# Patient Record
Sex: Male | Born: 1945 | Race: White | Hispanic: No | Smoking: Former smoker
Health system: Southern US, Community
[De-identification: ages and names within clinical notes are randomized; demographics above are authoritative.]

## PROBLEM LIST (undated history)

## (undated) DIAGNOSIS — F329 Major depressive disorder, single episode, unspecified: Secondary | ICD-10-CM

## (undated) DIAGNOSIS — N429 Disorder of prostate, unspecified: Secondary | ICD-10-CM

## (undated) DIAGNOSIS — Z87442 Personal history of urinary calculi: Secondary | ICD-10-CM

## (undated) DIAGNOSIS — K219 Gastro-esophageal reflux disease without esophagitis: Secondary | ICD-10-CM

## (undated) DIAGNOSIS — F32A Depression, unspecified: Secondary | ICD-10-CM

## (undated) DIAGNOSIS — E119 Type 2 diabetes mellitus without complications: Secondary | ICD-10-CM

## (undated) HISTORY — DX: Major depressive disorder, single episode, unspecified: F32.9

## (undated) HISTORY — DX: Depression, unspecified: F32.A

## (undated) HISTORY — PX: PROSTATE BIOPSY: SHX241

## (undated) HISTORY — PX: HERNIA REPAIR: SHX51

## (undated) HISTORY — DX: Disorder of prostate, unspecified: N42.9

## (undated) HISTORY — PX: HAND RECONSTRUCTION: SHX1730

## (undated) HISTORY — PX: KNEE RECONSTRUCTION: SHX5883

## (undated) HISTORY — PX: COLONOSCOPY: SHX174

## (undated) HISTORY — DX: Type 2 diabetes mellitus without complications: E11.9

---

## 2007-01-15 ENCOUNTER — Ambulatory Visit: Payer: Self-pay | Admitting: Family Medicine

## 2008-12-23 ENCOUNTER — Ambulatory Visit: Payer: Self-pay | Admitting: Gastroenterology

## 2011-04-08 ENCOUNTER — Emergency Department: Payer: Self-pay | Admitting: Emergency Medicine

## 2014-08-14 ENCOUNTER — Encounter: Payer: Self-pay | Admitting: Family Medicine

## 2014-08-14 ENCOUNTER — Ambulatory Visit (INDEPENDENT_AMBULATORY_CARE_PROVIDER_SITE_OTHER): Payer: Self-pay | Admitting: Family Medicine

## 2014-08-14 VITALS — BP 114/68 | HR 84 | Temp 98.0°F | Resp 16 | Ht 71.0 in | Wt 217.0 lb

## 2014-08-14 DIAGNOSIS — N400 Enlarged prostate without lower urinary tract symptoms: Secondary | ICD-10-CM | POA: Insufficient documentation

## 2014-08-14 DIAGNOSIS — K5900 Constipation, unspecified: Secondary | ICD-10-CM

## 2014-08-14 DIAGNOSIS — K648 Other hemorrhoids: Secondary | ICD-10-CM

## 2014-08-14 DIAGNOSIS — K644 Residual hemorrhoidal skin tags: Secondary | ICD-10-CM | POA: Insufficient documentation

## 2014-08-14 MED ORDER — HYDROCORTISONE 2.5 % RE CREA: 1.0000 "application " | TOPICAL_CREAM | Freq: Two times a day (BID) | RECTAL | Status: AC

## 2014-08-14 MED ORDER — PSYLLIUM 28 % PO PACK: 1.0000 | PACK | Freq: Two times a day (BID) | ORAL | Status: DC

## 2014-08-14 NOTE — Assessment & Plan Note (Signed)
Pt would like to see a new urologist for prostate issues. Take finasteride daily- dribble incontinence worsening.  Referral placed to urology in [facility] for patient preference.

## 2014-08-14 NOTE — Assessment & Plan Note (Signed)
Pt has tender large external hemorrhoids. External cream BID for 7 days. Pt would like them removed, referred to surgery.

## 2014-08-14 NOTE — Assessment & Plan Note (Signed)
Likely due to diet changes. Encouraged OTC bisacodyl if pt did not feel he evacuated bowels last night. Otherwise daily fiber supplement for 6 weeks.

## 2014-08-14 NOTE — Patient Instructions (Addendum)
Constipation:  I believe the removal of fiber in your diet has contributed. If you feel you fully evacuated your bowels last night, start daily benefiber or metamucil fiber supplements. You can also use a stool softener OTC if needed. If you still feel consitpation take 10mg  bisacodyl OTC for 3 days.  This will help you evacuate your bowel.   I recommend 6 weeks of fiber supplement to help with constipation.  Hemorrhoids: We have referred you to surgery for a consultation about your hemorrhoids. You can use the cream I prescribed twice daily for 7 days to help with your hemorrhoids. Avoiding constipation will also help with your symptoms.   Hemorrhoids Hemorrhoids are swollen veins around the rectum or anus. There are two types of hemorrhoids:   Internal hemorrhoids. These occur in the veins just inside the rectum. They may poke through to the outside and become irritated and painful.  External hemorrhoids. These occur in the veins outside the anus and can be felt as a painful swelling or hard lump near the anus. CAUSES  Pregnancy.   Obesity.   Constipation or diarrhea.   Straining to have a bowel movement.   Sitting for long periods on the toilet.  Heavy lifting or other activity that caused you to strain.  Anal intercourse. SYMPTOMS   Pain.   Anal itching or irritation.   Rectal bleeding.   Fecal leakage.   Anal swelling.   One or more lumps around the anus.  DIAGNOSIS  Your caregiver may be able to diagnose hemorrhoids by visual examination. Other examinations or tests that may be performed include:   Examination of the rectal area with a gloved hand (digital rectal exam).   Examination of anal canal using a small tube (scope).   A blood test if you have lost a significant amount of blood.  A test to look inside the colon (sigmoidoscopy or colonoscopy). TREATMENT Most hemorrhoids can be treated at home. However, if symptoms do not seem to be getting  better or if you have a lot of rectal bleeding, your caregiver may perform a procedure to help make the hemorrhoids get smaller or remove them completely. Possible treatments include:   Placing a rubber band at the base of the hemorrhoid to cut off the circulation (rubber band ligation).   Injecting a chemical to shrink the hemorrhoid (sclerotherapy).   Using a tool to burn the hemorrhoid (infrared light therapy).   Surgically removing the hemorrhoid (hemorrhoidectomy).   Stapling the hemorrhoid to block blood flow to the tissue (hemorrhoid stapling).  HOME CARE INSTRUCTIONS   Eat foods with fiber, such as whole grains, beans, nuts, fruits, and vegetables. Ask your doctor about taking products with added fiber in them (fibersupplements).  Increase fluid intake. Drink enough water and fluids to keep your urine clear or pale yellow.   Exercise regularly.   Go to the bathroom when you have the urge to have a bowel movement. Do not wait.   Avoid straining to have bowel movements.   Keep the anal area dry and clean. Use wet toilet paper or moist towelettes after a bowel movement.   Medicated creams and suppositories may be used or applied as directed.   Only take over-the-counter or prescription medicines as directed by your caregiver.   Take warm sitz baths for 15-20 minutes, 3-4 times a day to ease pain and discomfort.   Place ice packs on the hemorrhoids if they are tender and swollen. Using ice packs between sitz baths  may be helpful.   Put ice in a plastic bag.   Place a towel between your skin and the bag.   Leave the ice on for 15-20 minutes, 3-4 times a day.   Do not use a donut-shaped pillow or sit on the toilet for long periods. This increases blood pooling and pain.  SEEK MEDICAL CARE IF:  You have increasing pain and swelling that is not controlled by treatment or medicine.  You have uncontrolled bleeding.  You have difficulty or you are unable  to have a bowel movement.  You have pain or inflammation outside the area of the hemorrhoids. MAKE SURE YOU:  Understand these instructions.  Will watch your condition.  Will get help right away if you are not doing well or get worse. Document Released: 01/07/2000 Document Revised: 12/27/2011 Document Reviewed: 11/14/2011 [facility] Patient Information 2015 [place], [place]. This information is not intended to replace advice given to you by your health care provider. Make sure you discuss any questions you have with your health care provider.

## 2014-08-14 NOTE — Progress Notes (Signed)
Subjective:    Patient ID: Adam Mcmahon, male    DOB: Aug 30, 1945, 69 y.o.   MRN: [id number]  HPI: Adam Mcmahon is a 69 y.o. male presenting on 08/14/2014 for Hemorrhoids   Constipation This is a recurrent problem. Associated symptoms include difficulty urinating and rectal pain. Pertinent negatives include no fever.    Pt presents for constipation and hemorrhoids. Pt felt he was impacted last night and used ducloax and fleets enema. He has had issues with constipation in the past.  Pt has started metamucil to help with constipation.  Pt has had longterm issues with hemorrhoids. They occasional bleed. After impaction, they were bleeding last night. He feels they are interfering with his bowel movements. They are occasionally painful.  As the visit was finishing pt mentioned he would like to see a urologist about his prostate. He has been on finasteride for several years. Was previously seeing [facility] urology but refuses to go back. He would like to see a urologist in [place].   Past Medical History  Diagnosis Date  . Prostate disease   . Diabetes mellitus without complication   . Depression     No current outpatient prescriptions on file prior to visit.   No current facility-administered medications on file prior to visit.    Review of Systems  Constitutional: Negative for fever and chills.  HENT: Negative.   Respiratory: Negative for chest tightness, shortness of breath and wheezing.   Cardiovascular: Negative for chest pain, palpitations and leg swelling.  Gastrointestinal: Positive for constipation and rectal pain.  Genitourinary: Positive for difficulty urinating. Negative for hematuria, penile swelling and testicular pain.  Musculoskeletal: Negative.   Neurological: Negative.    Per HPI unless specifically indicated above     Objective:    BP 114/68 mmHg  Pulse 84  Temp(Src) 98 F (36.7 C) (Oral)  Resp 16  Ht 5\' 11"  (1.803 m)  Wt 217 lb (98.431 kg)   BMI 30.28 kg/m2  Wt Readings from Last 3 Encounters:  08/14/14 217 lb (98.431 kg)    Physical Exam  Constitutional: He appears well-developed and well-nourished. No distress.  HENT:  Head: Normocephalic and atraumatic.  Cardiovascular: Normal rate and regular rhythm.  Exam reveals no gallop and no friction rub.   No murmur heard. Pulmonary/Chest: Effort normal and breath sounds normal. He has no wheezes. He exhibits no tenderness.  Abdominal: Soft. Bowel sounds are normal. There is no tenderness. There is no guarding.  Genitourinary: Rectal exam shows external hemorrhoid (Large external hemorrhoids, tender to palpation. ). Rectal exam shows anal tone normal.  Due to hemorrhoid tenderness, was unable to perform internal exam due to pt discomfort.   Skin: He is not diaphoretic.   No results found for this or any previous visit.    Assessment & Plan:   Problem List Items Addressed This Visit      Cardiovascular and Mediastinum   External hemorrhoid - Primary    Pt has tender large external hemorrhoids. External cream BID for 7 days. Pt would like them removed, referred to surgery.       Relevant Medications   aspirin 81 MG tablet   hydrocortisone (ANUSOL-HC) 2.5 % rectal cream     Digestive   Constipation    Likely due to diet changes. Encouraged OTC bisacodyl if pt did not feel he evacuated bowels last night. Otherwise daily fiber supplement for 6 weeks.       Relevant Medications   psyllium (METAMUCIL SMOOTH TEXTURE)  28 % packet     Genitourinary   BPH (benign prostatic hyperplasia)    Pt would like to see a new urologist for prostate issues. Take finasteride daily- dribble incontinence worsening.  Referral placed to urology in [facility] for patient preference.       Relevant Medications   finasteride (PROSCAR) 5 MG tablet   Other Relevant Orders   Ambulatory referral to Urology      Meds ordered this encounter  Medications  . DISCONTD: TOLAK 4 % CREA    Sig:  APP TO ENTIRE FOREHEAD QD FOR 4 WEEKS    Refill:  1  . finasteride (PROSCAR) 5 MG tablet    Sig: Take 5 mg by mouth daily.  . sertraline (ZOLOFT) 50 MG tablet    Sig: Take 50 mg by mouth daily.  Marland Kitchen aspirin 81 MG tablet    Sig: Take 81 mg by mouth daily.  . cholecalciferol (VITAMIN D) 1000 UNITS tablet    Sig: Take 1,000 Units by mouth daily.  . hydrocortisone (ANUSOL-HC) 2.5 % rectal cream    Sig: Place 1 application rectally 2 (two) times daily. For 7 days.    Dispense:  30 g    Refill:  0    Order Specific Question:  Supervising Provider    Answer:  Arlis Porta [[id number]]  . psyllium (METAMUCIL SMOOTH TEXTURE) 28 % packet    Sig: Take 1 packet by mouth 2 (two) times daily.    Dispense:  1 packet    Refill:  11    Order Specific Question:  Supervising Provider    Answer:  Arlis Porta [phone]      Follow up plan: Return if symptoms worsen or fail to improve.

## 2014-08-17 ENCOUNTER — Encounter: Payer: Self-pay | Admitting: General Surgery

## 2014-08-25 ENCOUNTER — Encounter: Payer: Self-pay | Admitting: General Surgery

## 2014-08-25 ENCOUNTER — Ambulatory Visit (INDEPENDENT_AMBULATORY_CARE_PROVIDER_SITE_OTHER): Payer: Managed Care, Other (non HMO) | Admitting: General Surgery

## 2014-08-25 VITALS — BP 124/68 | HR 68 | Resp 14 | Ht 70.0 in | Wt 215.0 lb

## 2014-08-25 DIAGNOSIS — K644 Residual hemorrhoidal skin tags: Secondary | ICD-10-CM

## 2014-08-25 DIAGNOSIS — K648 Other hemorrhoids: Secondary | ICD-10-CM | POA: Diagnosis not present

## 2014-08-25 DIAGNOSIS — K625 Hemorrhage of anus and rectum: Secondary | ICD-10-CM | POA: Diagnosis not present

## 2014-08-25 LAB — POC HEMOCCULT BLD/STL (OFFICE/1-CARD/DIAGNOSTIC): FECAL OCCULT BLD: NEGATIVE

## 2014-08-25 MED ORDER — POLYETHYLENE GLYCOL 3350 17 GM/SCOOP PO POWD: 1.0000 | Freq: Once | ORAL | Status: DC

## 2014-08-25 MED ORDER — HYDROCORTISONE ACE-PRAMOXINE 2.5-1 % RE CREA: 1.0000 "application " | TOPICAL_CREAM | Freq: Three times a day (TID) | RECTAL | Status: DC

## 2014-08-25 NOTE — Progress Notes (Signed)
Patient ID: Adam Mcmahon, male   DOB: 07/28/1945, 69 y.o.   MRN: [id number]  Chief Complaint  Patient presents with  . Other    hemorrhoids    HPI Adam Mcmahon is a 69 y.o. male here today for a evaluation of hemorrhoids. Patient states the hemorrhoids been there for about ten years.In the last three month he has had a lot more bleeding in the bowl and on the paper, and pain.  HPI  Past Medical History  Diagnosis Date  . Prostate disease   . Diabetes mellitus without complication   . Depression     Past Surgical History  Procedure Laterality Date  . Prostate biopsy    . Colonoscopy    . Hernia repair      umbilical     Family History  Problem Relation Age of Onset  . Hypertension Mother     Social History History  Substance Use Topics  . Smoking status: Never Smoker   . Smokeless tobacco: Not on file  . Alcohol Use: 0.0 oz/week    0 Standard drinks or equivalent per week    No Known Allergies  Current Outpatient Prescriptions  Medication Sig Dispense Refill  . aspirin 81 MG tablet Take 81 mg by mouth daily.    . cholecalciferol (VITAMIN D) 1000 UNITS tablet Take 1,000 Units by mouth daily.    . finasteride (PROSCAR) 5 MG tablet Take 5 mg by mouth daily.    . psyllium (METAMUCIL SMOOTH TEXTURE) 28 % packet Take 1 packet by mouth 2 (two) times daily. 1 packet 11  . sertraline (ZOLOFT) 50 MG tablet Take 50 mg by mouth daily.    . hydrocortisone-pramoxine (ANALPRAM HC) 2.5-1 % rectal cream Place 1 application rectally 3 (three) times daily. 30 g 0  . polyethylene glycol powder (GLYCOLAX/MIRALAX) powder Take 255 g by mouth once. 255 g 0   No current facility-administered medications for this visit.    Review of Systems Review of Systems  Constitutional: Negative.   Respiratory: Negative.   Cardiovascular: Negative.   Gastrointestinal: Positive for anal bleeding.    Blood pressure 124/68, pulse 68, resp. rate 14, height 5\' 10"  (1.778 m), weight  215 lb (97.523 kg).  Physical Exam Physical Exam  Constitutional: He is oriented to person, place, and time. He appears well-developed and well-nourished.  Eyes: Conjunctivae are normal. No scleral icterus.  Neck: Neck supple.  Cardiovascular: Normal rate, regular rhythm and normal heart sounds.   Pulmonary/Chest: Effort normal and breath sounds normal.  Abdominal: Soft. Normal appearance and bowel sounds are normal. There is no hepatomegaly.  Genitourinary: Rectal exam shows external hemorrhoid (mildly prominent, soft , nontender. one on eac side.).  No apparent fissure or internal hemorrhoids. Digital exam shows a very firm large prostate.   Lymphadenopathy:    He has no cervical adenopathy.  Neurological: He is alert and oriented to person, place, and time.  Skin: Skin is warm and dry.    Data Reviewed None  Assessment    External hemorrhoids. Rectal bleeding-not sure of source    Plan    Rx sent  for Analpram cream   Colonoscopy with possible biopsy/polypectomy prn: Information regarding the procedure, including its potential risks and complications (including but not limited to perforation of the bowel, which may require emergency surgery to repair, and bleeding) was verbally given to the patient. Educational information regarding lower instestinal endoscopy was given to the patient. Written instructions for how to complete the bowel prep  using Miralax were provided. The importance of drinking ample fluids to avoid dehydration as a result of the prep emphasized. Patient is scheduled for a colonoscopy at [facility] on 09/23/14. He is aware to call the day before for his arrival time. Miralax prescription has been sent into the patient's pharmacy. He is aware of date and all instructions.   PCP:  Maximiano Coss 08/25/2014, 4:18 PM

## 2014-08-25 NOTE — Patient Instructions (Addendum)
The patient will be encouraged to make use of a daily fiber supplement. Colonoscopy A colonoscopy is an exam to look at the entire large intestine (colon). This exam can help find problems such as tumors, polyps, inflammation, and areas of bleeding. The exam takes about 1 hour.  LET [facility] CARE PROVIDER KNOW ABOUT:   Any allergies you have.  All medicines you are taking, including vitamins, herbs, eye drops, creams, and over-the-counter medicines.  Previous problems you or members of your family have had with the use of anesthetics.  Any blood disorders you have.  Previous surgeries you have had.  Medical conditions you have. RISKS AND COMPLICATIONS  Generally, this is a safe procedure. However, as with any procedure, complications can occur. Possible complications include:  Bleeding.  Tearing or rupture of the colon wall.  Reaction to medicines given during the exam.  Infection (rare). BEFORE THE PROCEDURE   Ask your health care provider about changing or stopping your regular medicines.  You may be prescribed an oral bowel prep. This involves drinking a large amount of medicated liquid, starting the day before your procedure. The liquid will cause you to have multiple loose stools until your stool is almost clear or light green. This cleans out your colon in preparation for the procedure.  Do not eat or drink anything else once you have started the bowel prep, unless your health care provider tells you it is safe to do so.  Arrange for someone to drive you home after the procedure. PROCEDURE   You will be given medicine to help you relax (sedative).  You will lie on your side with your knees bent.  A long, flexible tube with a light and camera on the end (colonoscope) will be inserted through the rectum and into the colon. The camera sends video back to a computer screen as it moves through the colon. The colonoscope also releases carbon dioxide gas to inflate the  colon. This helps your health care provider see the area better.  During the exam, your health care provider may take a small tissue sample (biopsy) to be examined under a microscope if any abnormalities are found.  The exam is finished when the entire colon has been viewed. AFTER THE PROCEDURE   Do not drive for 24 hours after the exam.  You may have a small amount of blood in your stool.  You may pass moderate amounts of gas and have mild abdominal cramping or bloating. This is caused by the gas used to inflate your colon during the exam.  Ask when your test results will be ready and how you will get your results. Make sure you get your test results. Document Released: 01/07/2000 Document Revised: 10/30/2012 Document Reviewed: 09/16/2012 [facility] Patient Information 2015 [place], [place]. This information is not intended to replace advice given to you by your health care provider. Make sure you discuss any questions you have with your health care provider.  Patient is scheduled for a colonoscopy at [facility] on 09/23/14. He is aware to call the day before for his arrival time. Miralax prescription has been sent into the patient's pharmacy. He is aware of date and all instructions.

## 2014-08-26 ENCOUNTER — Telehealth: Payer: Self-pay | Admitting: *Deleted

## 2014-08-26 NOTE — Telephone Encounter (Signed)
Patient called the office to cancel colonoscopy that was scheduled at [facility] for 09-23-14. This patient states he does not want to have done at this time.  He was instructed to call the office when he would like to proceed with colonoscopy.   [facility] has been notified of cancellation.

## 2014-09-23 ENCOUNTER — Ambulatory Visit: Admit: 2014-09-23 | Payer: Self-pay | Admitting: General Surgery

## 2014-09-23 SURGERY — COLONOSCOPY WITH PROPOFOL
Anesthesia: General

## 2014-11-16 ENCOUNTER — Encounter: Payer: Self-pay | Admitting: Family Medicine

## 2014-11-16 ENCOUNTER — Ambulatory Visit (INDEPENDENT_AMBULATORY_CARE_PROVIDER_SITE_OTHER): Payer: Managed Care, Other (non HMO) | Admitting: Family Medicine

## 2014-11-16 VITALS — BP 146/75 | HR 82 | Temp 98.7°F | Resp 16 | Ht 70.0 in | Wt 218.0 lb

## 2014-11-16 DIAGNOSIS — F329 Major depressive disorder, single episode, unspecified: Secondary | ICD-10-CM | POA: Diagnosis not present

## 2014-11-16 DIAGNOSIS — J40 Bronchitis, not specified as acute or chronic: Secondary | ICD-10-CM

## 2014-11-16 DIAGNOSIS — F32A Depression, unspecified: Secondary | ICD-10-CM

## 2014-11-16 DIAGNOSIS — R030 Elevated blood-pressure reading, without diagnosis of hypertension: Secondary | ICD-10-CM

## 2014-11-16 DIAGNOSIS — E559 Vitamin D deficiency, unspecified: Secondary | ICD-10-CM | POA: Diagnosis not present

## 2014-11-16 DIAGNOSIS — E669 Obesity, unspecified: Secondary | ICD-10-CM | POA: Diagnosis not present

## 2014-11-16 DIAGNOSIS — E119 Type 2 diabetes mellitus without complications: Secondary | ICD-10-CM

## 2014-11-16 DIAGNOSIS — IMO0001 Reserved for inherently not codable concepts without codable children: Secondary | ICD-10-CM

## 2014-11-16 MED ORDER — AZITHROMYCIN 250 MG PO TABS: ORAL_TABLET | ORAL | Status: DC

## 2014-11-16 NOTE — Progress Notes (Signed)
Date:  11/16/2014   Name:  Adam Mcmahon   DOB:  1945/07/14   MRN:  [id number]  PCP:  Leata Mouse, NP    Chief Complaint: Cough   History of Present Illness:  This is a 69 y.o. male with hx BPH, diet-controlled DM, vitamin D deficiency reports 5 wk hx of non-productive cough. Non-smoker x 30 yrs, no known exposures, no recent med changes.  Review of Systems:  Review of Systems  Constitutional: Negative for fever, chills and unexpected weight change.  HENT: Negative for congestion, ear pain, rhinorrhea, sinus pressure and sore throat.   Respiratory: Negative for shortness of breath.   Cardiovascular: Negative for chest pain and leg swelling.    Patient Active Problem List   Diagnosis Date Noted  . Obesity, Class I, BMI 30-34.9 11/16/2014  . Diabetes mellitus type 2, controlled, without complications ([place]) 40/98/1191  . Vitamin D deficiency 11/16/2014  . External hemorrhoid 08/14/2014  . Constipation 08/14/2014  . BPH (benign prostatic hyperplasia) 08/14/2014    Prior to Admission medications   Medication Sig Start Date End Date Taking? Authorizing Provider  aspirin 81 MG tablet Take 81 mg by mouth daily.   Yes Historical Provider, MD  cholecalciferol (VITAMIN D) 1000 UNITS tablet Take 1,000 Units by mouth daily.   Yes Historical Provider, MD  finasteride (PROSCAR) 5 MG tablet Take 5 mg by mouth daily.   Yes Historical Provider, MD  hydrocortisone-pramoxine (ANALPRAM HC) 2.5-1 % rectal cream Place 1 application rectally 3 (three) times daily. 08/25/14  Yes Seeplaputhur Robinette Haines, MD  sertraline (ZOLOFT) 50 MG tablet Take 50 mg by mouth daily.   Yes Historical Provider, MD  azithromycin (ZITHROMAX) 250 MG tablet Take two tablets today then one tablet daily for next four days. 11/16/14   Adline Potter, MD    No Known Allergies  Past Surgical History  Procedure Laterality Date  . Prostate biopsy    . Colonoscopy    . Hernia repair      umbilical     Social History   Substance Use Topics  . Smoking status: Never Smoker   . Smokeless tobacco: Not on file  . Alcohol Use: 0.0 oz/week    0 Standard drinks or equivalent per week    Family History  Problem Relation Age of Onset  . Hypertension Mother     Medication list has been reviewed and updated.  Physical Examination: BP 146/75 mmHg  Pulse 82  Temp(Src) 98.7 F (37.1 C) (Oral)  Resp 16  Ht 5\' 10"  (1.778 m)  Wt 218 lb (98.884 kg)  BMI 31.28 kg/m2  Physical Exam  Constitutional: He appears well-developed and well-nourished.  HENT:  Mouth/Throat: Oropharynx is clear and moist.  Neck: No thyromegaly present.  Cardiovascular: Normal rate, regular rhythm and normal heart sounds.   Pulmonary/Chest: Effort normal and breath sounds normal.  Lymphadenopathy:    He has no cervical adenopathy.  Nursing note and vitals reviewed.   Assessment and Plan:  1. Bronchitis Persistent, discussed need for CXR if sxs unresolved after abxs. - azithromycin (ZITHROMAX) 250 MG tablet; Take two tablets today then one tablet daily for next four days.  Dispense: 6 tablet; Refill: 0  2. Obesity, Class I, BMI 30-34.9 Needs f/u to discuss chronic medical problems  3. Controlled type 2 diabetes mellitus without complication, without long-term current use of insulin ([place])  4. Vitamin D deficiency  5. Depression  6. Elevated BP   Return in about 4 weeks (around 12/14/2014).  Satira Anis. [facility]  11/16/2014

## 2015-01-27 ENCOUNTER — Telehealth: Payer: Self-pay

## 2015-01-27 NOTE — Telephone Encounter (Signed)
Called to offer Flu shot No Answer

## 2015-03-18 ENCOUNTER — Other Ambulatory Visit: Payer: Self-pay | Admitting: Family Medicine

## 2018-02-04 ENCOUNTER — Ambulatory Visit (INDEPENDENT_AMBULATORY_CARE_PROVIDER_SITE_OTHER): Payer: Medicare Other | Admitting: Family Medicine

## 2018-02-04 ENCOUNTER — Encounter: Payer: Self-pay | Admitting: Family Medicine

## 2018-02-04 VITALS — BP 166/75 | HR 60 | Temp 98.2°F | Resp 16 | Ht 71.0 in | Wt 198.8 lb

## 2018-02-04 DIAGNOSIS — R109 Unspecified abdominal pain: Secondary | ICD-10-CM | POA: Diagnosis not present

## 2018-02-04 DIAGNOSIS — N200 Calculus of kidney: Secondary | ICD-10-CM

## 2018-02-04 MED ORDER — BACLOFEN 10 MG PO TABS: 5.0000 mg | ORAL_TABLET | Freq: Three times a day (TID) | ORAL | 0 refills | Status: AC | PRN

## 2018-02-04 MED ORDER — TAMSULOSIN HCL 0.4 MG PO CAPS: 0.4000 mg | ORAL_CAPSULE | Freq: Every day | ORAL | 0 refills | Status: DC | PRN

## 2018-02-04 NOTE — Patient Instructions (Addendum)
Thank you for coming to the office today.  You most likely have a Kidney Stone (Nephrolithiasis)  It can cause flank or side pain, abdominal pain, or pain with urination. Also blood in urine is very common.  You have been referred to Urology - Dr Maryan Puls - [facility]  Very important to see Urologist as scheduled and discuss removal options and they may repeat imaging  If you get any of the following it is more of an emergency and may need to go to hospital directly or we can try to contact Urology sooner  - Fever, chills sweats nausea vomiting cannot tolerate antibiotic - Cannot pee or void at all for 12 hours straight, despite straining and medicine - Worsening kidney function Creatinine - based on lab test today  If worsening symptoms we need to check a urine / and maybe an X-ray  Also start Flomax 0.4mg  daily for help improve urine flow  Ibuprofen can be increased from 200mg  every 4 hours - you can go up to 2 to 3 pills per dose = 400 to 600mg  per dose - goal to do this up to THREE times a day only - instead of every 4 hours.  ----------------------------------------------  Recommend trial of Anti-inflammatory with Naproxen (Aleve generic) 220 or 250 - goal is to take TWO pills = 500mg  per dose - take with food and plenty of water TWICE daily every day (breakfast and dinner), for next 1-2 weeks, then you may take only as needed - DO NOT TAKE any ibuprofen, aleve, motrin while you are taking this medicine  - It is safe to take Tylenol Ext Str 500mg  tabs - take 1 to 2 (max dose 1000mg ) every 6 hours as needed for breakthrough pain, max 24 hour daily dose is 6 to 8 tablets or 4000mg    Please schedule a Follow-up Appointment to: Return in about 6 weeks (around 03/18/2018) for Follow-up 6 weeks to 3 months - Diabetes A1c.  If you have any other questions or concerns, please feel free to call the office or send a message through [place]. You may also schedule an earlier  appointment if necessary.  Additionally, you may be receiving a survey about your experience at our office within a few days to 1 week by e-mail or mail. We value your feedback.  Nobie Putnam, DO [place]

## 2018-02-04 NOTE — Progress Notes (Signed)
Subjective:    Patient ID: Adam Mcmahon, male    DOB: 03-Jul-1945, 73 y.o.   MRN: [id number]  Adam Mcmahon is a 73 y.o. male presenting on 02/04/2018 for Back Pain (lower back pain onset 4 days getting worst)  Patient presents for a same day appointment.  HPI   LOW BACK PAIN, Acute / History Nephrolithiasis - Reports symptoms started about 4 days ago with acute new onset R low back pain, without inciting injury. - Today seems to be persistent, seems worse but not improving. Describes pain as severe pain, with localized R flank low back region, intermittent worsening with intensity with position change if crosses R leg over left. No pain radiating to legs. Remains localized. - Taking Ibuprofen 200mg  every 4 hours as needed with improvement, may only last 3 hours then wears off, he is not taking Tylenol - Not tried heating pad at night, ice, muscle - History prior kidney stone - surgically removed by [facility] back in approximately 2009 - Prior similar back pain flares improved with - History of enlarged prostate BPH - he takes herbal treatment. He no longer plans to return to Dr Bernardo Heater and [facility] group in past, now he request to return to Dr Yves Dill - Admits some reduced urine flow - Denies any fevers/chills, numbness, tingling, weakness, loss of control bladder/bowel incontinence or retention, unintentional wt loss, night sweats - Denies dysuria, hematuria  Health Maintenance: He is long overdue for most screening evaluations / tests - he will return now for Diabetes follow-up in 3 months to discuss screening / chronic health conditions.  Depression screen PHQ 2/9 08/14/2014  Decreased Interest 0  Down, Depressed, Hopeless 0  PHQ - 2 Score 0    Social History   Tobacco Use  . Smoking status: Former Smoker  Substance Use Topics  . Alcohol use: Yes    Alcohol/week: 0.0 standard drinks  . Drug use: No    Review of Systems Per HPI unless specifically indicated  above     Objective:    BP (!) 166/75   Pulse 60   Temp 98.2 F (36.8 C) (Oral)   Resp 16   Ht 5\' 11"  (1.803 m)   Wt 198 lb 12.8 oz (90.2 kg)   BMI 27.73 kg/m   Wt Readings from Last 3 Encounters:  02/04/18 198 lb 12.8 oz (90.2 kg)  11/16/14 218 lb (98.9 kg)  08/25/14 215 lb (97.5 kg)    Physical Exam Vitals signs and nursing note reviewed.  Constitutional:      General: He is not in acute distress.    Appearance: He is well-developed. He is not diaphoretic.     Comments: Well-appearing, currently comfortable, cooperative  HENT:     Head: Normocephalic and atraumatic.  Eyes:     General:        Right eye: No discharge.        Left eye: No discharge.     Conjunctiva/sclera: Conjunctivae normal.  Cardiovascular:     Rate and Rhythm: Normal rate.  Pulmonary:     Effort: Pulmonary effort is normal.  Musculoskeletal:     Comments: Low Back Inspection: Normal appearance, no spinal deformity, symmetrical. Palpation: No tenderness over spinous processes. Localized R sided lower to mid flank under ribcage posteriorly with some reproduced tenderness to palpation without obvious muscle hypertonicity ROM: Mostly Full active ROM forward flex / back extension, rotation L/R - some slight discomfort on forward flex Special Testing: Seated SLR negative  for radicular pain bilaterally  Strength: Bilateral hip flex/ext 5/5, knee flex/ext 5/5, ankle dorsiflex/plantarflex 5/5 Neurovascular: intact distal sensation to light touch   Skin:    General: Skin is warm and dry.     Findings: No erythema or rash.  Neurological:     Mental Status: He is alert and oriented to person, place, and time.  Psychiatric:        Behavior: Behavior normal.     Comments: Well groomed, good eye contact, normal speech and thoughts        Assessment & Plan:   Problem List Items Addressed This Visit    None    Visit Diagnoses    Nephrolithiasis    -  Primary   Relevant Medications   tamsulosin  (FLOMAX) 0.4 MG CAPS capsule   baclofen (LIORESAL) 10 MG tablet   Other Relevant Orders   Ambulatory referral to Urology   Right flank pain          Acute R Low back pain vs Flank pain without associated sciatica Concern for R nephrolithiasis given prior history and patient report of symptoms similar Also suspect likely due to muscle spasm/strain / MSK component however w/o acute injury - No clear dx of prior OA/DJD of lumbar spine - No red flag symptoms. Negative SLR for radiculopathy - Inadequate conservative therapy  - Previously followed by Urologist in past, through [facility] now wants to return local  Plan: 1. Discussed back pain vs kidney stone - Empiric trial for nephrolithiasis Flomax 0.4mg  daily up to 2 weeks rx sent - Trial Baclofen muscle relaxant for ureterospasm vs back pain - PRN instructions - May add Naproxen OTC dosing x 2 BID PRN - and hold Ibuprofen - Add Tylenol PRN - Improve hydration - Offered KUB imaging vs UA dipstick today - patient declined - Referral also sent to [facility] Dr Maryan Puls for follow-up on nephrolithiasis Follow-up criteria given if not improving   Meds ordered this encounter  Medications  . tamsulosin (FLOMAX) 0.4 MG CAPS capsule    Sig: Take 1 capsule (0.4 mg total) by mouth daily as needed. For 1-2 weeks or until stone pass    Dispense:  30 capsule    Refill:  0  . baclofen (LIORESAL) 10 MG tablet    Sig: Take 0.5-1 tablets (5-10 mg total) by mouth 3 (three) times daily as needed for muscle spasms.    Dispense:  30 each    Refill:  0   Orders Placed This Encounter  Procedures  . Ambulatory referral to Urology    Referral Priority:   Routine    Referral Type:   Consultation    Referral Reason:   Specialty Services Required    Referred to Provider:   Royston Cowper, MD    Requested Specialty:   Urology    Number of Visits Requested:   1    Follow up plan: Return in about 6 weeks (around 03/18/2018) for Follow-up 6 weeks  to 3 months - Diabetes A1c.  Nobie Putnam, [facility] 02/04/2018, 2:05 PM

## 2018-02-06 ENCOUNTER — Telehealth: Payer: Self-pay | Admitting: Family Medicine

## 2018-02-06 NOTE — Telephone Encounter (Signed)
Here is my typical AVS handout advice for constipation - see if this will help.  For Constipation (less frequent bowel movement that can be hard dry or involve straining).  Recommend trying OTC Miralax 17g = 1 capful in large glass water once daily for now, try several days to see if working, goal is soft stool or BM 1-2 times daily, if too loose then reduce dose or try every other day. If not effective may need to increase it to 2 doses at once in AM or may do 1 in morning and 1 in afternoon/evening  - This medicine is very safe and can be used often without any problem and will not make you dehydrated. It is good for use on AS NEEDED BASIS or even MAINTENANCE therapy for longer term for several days to weeks at a time to help regulate bowel movements  Other more natural remedies or preventative treatment: - Increase hydration with water - Increase fiber in diet (high fiber foods = vegetables, leafy greens, oats/grains) - May take OTC Fiber supplement (metamucil powder or pill/gummy) - May try OTC Probiotic  Nobie Putnam, DO [facility] 02/06/2018, 2:00 PM

## 2018-02-06 NOTE — Telephone Encounter (Signed)
Patient advised wanted to let Dr. Gwyndolyn Kaufman about his appointment with Dr. Nash Mantis urologist just for [place] is on 02/13/2018.

## 2018-02-06 NOTE — Telephone Encounter (Signed)
Pt asked what he could take for constipation.  He has not had a bowel movement for 4 days [phone]

## 2018-02-14 ENCOUNTER — Telehealth: Payer: Self-pay | Admitting: Family Medicine

## 2018-02-14 DIAGNOSIS — R109 Unspecified abdominal pain: Secondary | ICD-10-CM

## 2018-02-14 DIAGNOSIS — N200 Calculus of kidney: Secondary | ICD-10-CM

## 2018-02-14 NOTE — Telephone Encounter (Signed)
Patient's Sxs for flank pain were gone past week he had cancelled appointment which was scheduled on Wednesday but his Sxs started today he thinks may be he is passing another kidney stone and now wants KUB imaging done which was declined at his office visit after getting prelimnary report he will decide to see urologist or not.

## 2018-02-14 NOTE — Telephone Encounter (Signed)
He was already referred to Dr Maryan Puls, apt was for 02/13/18.  I can order KUB he can walk in here for it, but results may not be until tomorrow Friday 1/23.  He should call Dr Yves Dill back and re-schedule is my recommendation, as I do not plan to treat long term kidney stones. He will most likely need to be evaluated by Urologist, no matter what, because KUB can miss kidney stones often.  Nobie Putnam, [facility] 02/14/2018, 1:06 PM

## 2018-02-14 NOTE — Telephone Encounter (Signed)
Patient was here for xray advised Thayer Headings to replay information to the patient. He will schedule appointment with urology.

## 2018-02-14 NOTE — Telephone Encounter (Signed)
Left message for patient to call back  

## 2018-02-15 ENCOUNTER — Ambulatory Visit
Admission: RE | Admit: 2018-02-15 | Discharge: 2018-02-15 | Disposition: A | Discharge status: Home / Self Care | Payer: Medicare Other | Source: Ambulatory Visit | Attending: Family Medicine | Admitting: Family Medicine

## 2018-02-15 DIAGNOSIS — N200 Calculus of kidney: Secondary | ICD-10-CM

## 2018-02-15 DIAGNOSIS — R109 Unspecified abdominal pain: Secondary | ICD-10-CM

## 2018-02-25 ENCOUNTER — Other Ambulatory Visit: Payer: Self-pay | Admitting: Urology

## 2018-02-25 DIAGNOSIS — R109 Unspecified abdominal pain: Secondary | ICD-10-CM

## 2018-02-25 DIAGNOSIS — R972 Elevated prostate specific antigen [PSA]: Secondary | ICD-10-CM

## 2018-03-06 ENCOUNTER — Ambulatory Visit: Admission: RE | Admit: 2018-03-06 | Payer: Medicare Other | Source: Ambulatory Visit

## 2018-03-06 ENCOUNTER — Ambulatory Visit
Admission: RE | Admit: 2018-03-06 | Discharge: 2018-03-06 | Disposition: A | Discharge status: Home / Self Care | Payer: Medicare Other | Source: Ambulatory Visit | Attending: Urology | Admitting: Urology

## 2018-03-06 DIAGNOSIS — R972 Elevated prostate specific antigen [PSA]: Secondary | ICD-10-CM

## 2018-03-06 DIAGNOSIS — R109 Unspecified abdominal pain: Secondary | ICD-10-CM | POA: Insufficient documentation

## 2018-03-06 LAB — POCT I-STAT CREATININE: CREATININE: 1.4 mg/dL — AB (ref 0.61–1.24)

## 2018-03-06 MED ORDER — GADOBUTROL 1 MMOL/ML IV SOLN
9.0000 mL | Freq: Once | INTRAVENOUS | Status: AC | PRN
  Administered 2018-03-06: 9 mL via INTRAVENOUS

## 2018-03-14 ENCOUNTER — Other Ambulatory Visit: Payer: Self-pay

## 2018-03-14 ENCOUNTER — Encounter
Admission: RE | Admit: 2018-03-14 | Discharge: 2018-03-14 | Disposition: A | Discharge status: Home / Self Care | Payer: Medicare Other | Source: Ambulatory Visit | Attending: Urology | Admitting: Urology

## 2018-03-14 DIAGNOSIS — R9431 Abnormal electrocardiogram [ECG] [EKG]: Secondary | ICD-10-CM | POA: Diagnosis not present

## 2018-03-14 DIAGNOSIS — I4510 Unspecified right bundle-branch block: Secondary | ICD-10-CM | POA: Insufficient documentation

## 2018-03-14 DIAGNOSIS — Z01818 Encounter for other preprocedural examination: Secondary | ICD-10-CM | POA: Insufficient documentation

## 2018-03-14 HISTORY — DX: Gastro-esophageal reflux disease without esophagitis: K21.9

## 2018-03-14 HISTORY — DX: Personal history of urinary calculi: Z87.442

## 2018-03-14 LAB — CBC
HCT: 38.2 % — ABNORMAL LOW (ref 39.0–52.0)
Hemoglobin: 12.4 g/dL — ABNORMAL LOW (ref 13.0–17.0)
MCH: 28.8 pg (ref 26.0–34.0)
MCHC: 32.5 g/dL (ref 30.0–36.0)
MCV: 88.6 fL (ref 80.0–100.0)
Platelets: 226 10*3/uL (ref 150–400)
RBC: 4.31 MIL/uL (ref 4.22–5.81)
RDW: 12.4 % (ref 11.5–15.5)
WBC: 5.8 10*3/uL (ref 4.0–10.5)
nRBC: 0 % (ref 0.0–0.2)

## 2018-03-14 LAB — BASIC METABOLIC PANEL
Anion gap: 6 (ref 5–15)
BUN: 19 mg/dL (ref 8–23)
CO2: 28 mmol/L (ref 22–32)
Calcium: 9.2 mg/dL (ref 8.9–10.3)
Chloride: 103 mmol/L (ref 98–111)
Creatinine, Ser: 1.14 mg/dL (ref 0.61–1.24)
GFR calc Af Amer: >60 mL/min (ref >60)
GFR calc non Af Amer: >60 mL/min (ref >60)
Glucose, Bld: 113 mg/dL — ABNORMAL HIGH (ref 70–99)
Potassium: 3.9 mmol/L (ref 3.5–5.1)
Sodium: 137 mmol/L (ref 135–145)

## 2018-03-14 NOTE — Patient Instructions (Signed)
Your procedure is scheduled on: Tuesday 03/19/18   Report to [facility]. To find out your arrival time please call [phone] between 1PM - 3PM on Monday 03/18/18.   Remember: Instructions that are not followed completely may result in serious medical risk, up to and including death, or upon the discretion of your surgeon and anesthesiologist your surgery may need to be rescheduled.      _X__ 1. Do not eat food after midnight the night before your procedure.                 No gum chewing or hard candies. You may drink clear liquids up to 2 hours                 before you are scheduled to arrive for your surgery- DO NOT drink clear                 liquids within 2 hours of the start of your surgery.                 Clear Liquids include:  water, apple juice without pulp, clear carbohydrate                 drink such as Clearfast or Gatorade, Black Coffee or Tea (Do not add                 milk or creamer to coffee or tea).   __X__2.  On the morning of surgery brush your teeth with toothpaste and water, you may rinse your mouth with mouthwash if you wish.  Do not swallow any toothpaste or mouthwash.      _X__ 3.  No Alcohol for 24 hours before or after surgery.   __X__4.  Notify your doctor if there is any change in your medical condition      (cold, fever, infections).     Do not wear jewelry, make-up, hairpins, clips or nail polish. Do not wear lotions, powders, or perfumes.  Do not shave 48 hours prior to surgery. Men may shave face and neck. Do not bring valuables to the hospital.    [facility] is not responsible for any belongings or valuables.  Contacts, dentures/partials or body piercings may not be worn into surgery. Bring a case for your contacts, glasses or hearing aids, a denture cup will be supplied.    Patients discharged the day of surgery will not be allowed to drive home.   Please read over the following  fact sheets that you were given:   MRSA Information  __X__ Take these medicines the morning of surgery with A SIP OF WATER:     1. NONE    __X__ Stop Blood Thinners 3 days before your surgery: Aspirin   __X__ Stop Anti-inflammatories 7 days before surgery such as Advil, Ibuprofen, Motrin, BC or Goodies Powder, Naprosyn, Naproxen, Aleve, Aspirin, Meloxicam. May take Tylenol if needed for pain or discomfort.    __X__ Stop all herbal supplements until after your surgery. Do not begin taking any new herbal supplements until after your procedure.

## 2018-03-14 NOTE — Pre-Procedure Instructions (Signed)
Called the office to get clarification of antibiotic order on written order sheet. Dr. Yves Dill is out of the office. The secretary said he will call PAT in the morning to provide clarification.

## 2018-03-15 NOTE — H&P (Signed)
NAME: Adam Mcmahon, Adam Mcmahon MEDICAL RECORD IF:[id number] ACCOUNT [id number] DATE OF BIRTH:06-29-1945 FACILITY: [facility] LOCATION: [facility] PHYSICIAN:Tahiri Shareef Farrel Conners, MD  HISTORY AND PHYSICAL  DATE OF ADMISSION:  03/19/2018  CHIEF COMPLAINT:  Flank pain.  HISTORY OF PRESENT ILLNESS:  The patient is a 73 year old white male who presented to the office on 01/29 with intermittent right flank pain since 01/10.  He underwent CT scan on 02/12 which indicated a 2.9 cm soft tissue lesion in the distal right  ureter.  He also had some moderate right hydronephrosis.  He also was found to have an elevated PSA of 15.9 ng/mL on 01/29.  He subsequently underwent MRI scan of the prostate which revealed 2 PI-RADS category 4 lesions of the prostate.  Region of  interest 1 was located in the peripheral zone and region of interest 2 in the transition zone, both on the right side.  The patient comes in now for right retrograde with ureteroscopy and biopsy of the lesion in the ureter with possible stent placement  as well as for UroNav fusion-guided needle biopsy of the prostate.  ALLERGIES:  No drug allergies.  CURRENT MEDICATIONS:  Stinging Nettles, aspirin, chromium, ginger root, magnesium, MSM, vitamin D3, cinnamon, and folic acid.  PAST SURGICAL HISTORY: 1.  Right hand surgery in 1982. 2.  Right knee surgery in 1982. 3.  Right percutaneous nephrolithotomy 2009. 4.  Umbilical herniorrhaphy in 1995.  PAST AND CURRENT MEDICAL CONDITIONS:  Kidney stones.  REVIEW OF SYSTEMS:  The patient denies chest pain, shortness of breath, diabetes, stroke, hypertension or heart disease.  SOCIAL HISTORY:  The patient quit smoking in 1984 with a 10-pack-year history.  He denied alcohol use.  FAMILY HISTORY:  Father died at age 38 of intracranial aneurysm.  Mother died at age 28 of old age.  PHYSICAL EXAMINATION: VITAL SIGNS:  Height 5 feet 10, weight 195. GENERAL:  Well-nourished white male in no  distress. HEENT:  Sclerae were clear.  Pupils were equally round, reactive to light and accommodation.  Extraocular movements are intact. NECK:  No palpable masses or tenderness.  Thyroid gland was smooth and nontender.  No audible carotid bruits. LYMPHATIC:  No palpable neck or groin adenopathy. PULMONARY:  Lungs clear to auscultation. CARDIOVASCULAR:  Regular rhythm and rate without audible murmurs. ABDOMEN:  Soft, nontender abdomen.  No CVA tenderness. GENITOURINARY:  Circumcised.  Testes were smooth and nontender, 18 mL in size each. RECTAL:  40 g smooth, nontender prostate. NEUROMUSCULAR:  Alert and oriented x3.  IMPRESSION: 1.  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 2.  Elevated PSA. 3.  PI-RADS category 4 lesions of the right side of the prostate.  PLAN: 1.  Right retrograde with right ureteroscopy and biopsy of the ureteral tumor with possible stent placement. 2.  Vernelle Emerald fusion-guided needle biopsy of the prostate.  LN/NUANCE  D:03/14/2018 T:03/14/2018 JOB:[id number]

## 2018-03-19 ENCOUNTER — Encounter
Admission: RE | Disposition: A | Discharge status: Home / Self Care | Payer: Self-pay | Source: Ambulatory Visit | Attending: Urology

## 2018-03-19 ENCOUNTER — Ambulatory Visit
Admission: RE | Admit: 2018-03-19 | Discharge: 2018-03-19 | Disposition: A | Discharge status: Home / Self Care | Payer: Medicare Other | Source: Ambulatory Visit | Attending: Urology | Admitting: Urology

## 2018-03-19 ENCOUNTER — Encounter: Payer: Self-pay | Admitting: *Deleted

## 2018-03-19 ENCOUNTER — Other Ambulatory Visit: Payer: Self-pay

## 2018-03-19 ENCOUNTER — Ambulatory Visit: Payer: Medicare Other | Admitting: Anesthesiology

## 2018-03-19 DIAGNOSIS — E119 Type 2 diabetes mellitus without complications: Secondary | ICD-10-CM | POA: Insufficient documentation

## 2018-03-19 DIAGNOSIS — Z87891 Personal history of nicotine dependence: Secondary | ICD-10-CM | POA: Insufficient documentation

## 2018-03-19 DIAGNOSIS — C661 Malignant neoplasm of right ureter: Secondary | ICD-10-CM | POA: Insufficient documentation

## 2018-03-19 DIAGNOSIS — Z7982 Long term (current) use of aspirin: Secondary | ICD-10-CM | POA: Insufficient documentation

## 2018-03-19 DIAGNOSIS — Z87442 Personal history of urinary calculi: Secondary | ICD-10-CM | POA: Diagnosis not present

## 2018-03-19 DIAGNOSIS — N4289 Other specified disorders of prostate: Secondary | ICD-10-CM | POA: Diagnosis not present

## 2018-03-19 DIAGNOSIS — R972 Elevated prostate specific antigen [PSA]: Secondary | ICD-10-CM | POA: Insufficient documentation

## 2018-03-19 DIAGNOSIS — D4121 Neoplasm of uncertain behavior of right ureter: Secondary | ICD-10-CM | POA: Diagnosis present

## 2018-03-19 HISTORY — PX: CYSTOSCOPY W/ RETROGRADES: SHX1426

## 2018-03-19 HISTORY — PX: URETERAL BIOPSY: SHX6688

## 2018-03-19 HISTORY — PX: PROSTATE BIOPSY: SHX241

## 2018-03-19 SURGERY — BIOPSY, URETER
Anesthesia: General | Site: Ureter | Laterality: Right

## 2018-03-19 MED ORDER — ROCURONIUM BROMIDE 100 MG/10ML IV SOLN
INTRAVENOUS | Status: DC | PRN
  Administered 2018-03-19: 5 mg via INTRAVENOUS
  Administered 2018-03-19: 25 mg via INTRAVENOUS
  Administered 2018-03-19: 10 mg via INTRAVENOUS

## 2018-03-19 MED ORDER — MITOMYCIN CHEMO FOR BLADDER INSTILLATION 40 MG
40.0000 mg | Freq: Once | INTRAVENOUS | Status: AC
  Administered 2018-03-19: 40 mg via INTRAVESICAL
  Filled 2018-03-19: qty 40

## 2018-03-19 MED ORDER — LIDOCAINE HCL URETHRAL/MUCOSAL 2 % EX GEL
CUTANEOUS | Status: AC
  Filled 2018-03-19: qty 10

## 2018-03-19 MED ORDER — GENTAMICIN IN SALINE 1.6-0.9 MG/ML-% IV SOLN
80.0000 mg | Freq: Once | INTRAVENOUS | Status: AC
  Administered 2018-03-19: 80 mg via INTRAVENOUS
  Filled 2018-03-19: qty 50

## 2018-03-19 MED ORDER — CEFAZOLIN SODIUM-DEXTROSE 1-4 GM/50ML-% IV SOLN
1.0000 g | Freq: Once | INTRAVENOUS | Status: AC
  Administered 2018-03-19: 1 g via INTRAVENOUS

## 2018-03-19 MED ORDER — LIDOCAINE HCL URETHRAL/MUCOSAL 2 % EX GEL
CUTANEOUS | Status: DC | PRN
  Administered 2018-03-19: 1 via URETHRAL

## 2018-03-19 MED ORDER — SUGAMMADEX SODIUM 500 MG/5ML IV SOLN
INTRAVENOUS | Status: DC | PRN
  Administered 2018-03-19: 178 mg via INTRAVENOUS

## 2018-03-19 MED ORDER — ONDANSETRON HCL 4 MG/2ML IJ SOLN: 4.0000 mg | Freq: Once | INTRAMUSCULAR | Status: DC | PRN

## 2018-03-19 MED ORDER — SUCCINYLCHOLINE CHLORIDE 20 MG/ML IJ SOLN
INTRAMUSCULAR | Status: AC
  Filled 2018-03-19: qty 1

## 2018-03-19 MED ORDER — CEFAZOLIN SODIUM-DEXTROSE 1-4 GM/50ML-% IV SOLN
INTRAVENOUS | Status: AC
  Filled 2018-03-19: qty 50

## 2018-03-19 MED ORDER — PHENYLEPHRINE HCL 10 MG/ML IJ SOLN
INTRAMUSCULAR | Status: DC | PRN
  Administered 2018-03-19 (×4): 100 ug via INTRAVENOUS

## 2018-03-19 MED ORDER — MIDAZOLAM HCL 2 MG/2ML IJ SOLN
INTRAMUSCULAR | Status: DC | PRN
  Administered 2018-03-19: 0.5 mg via INTRAVENOUS

## 2018-03-19 MED ORDER — SUGAMMADEX SODIUM 200 MG/2ML IV SOLN
INTRAVENOUS | Status: AC
  Filled 2018-03-19: qty 2

## 2018-03-19 MED ORDER — MIDAZOLAM HCL 2 MG/2ML IJ SOLN
INTRAMUSCULAR | Status: AC
  Filled 2018-03-19: qty 2

## 2018-03-19 MED ORDER — SUGAMMADEX SODIUM 500 MG/5ML IV SOLN
INTRAVENOUS | Status: AC
  Filled 2018-03-19: qty 5

## 2018-03-19 MED ORDER — LEVOFLOXACIN IN D5W 500 MG/100ML IV SOLN
500.0000 mg | Freq: Once | INTRAVENOUS | Status: AC
  Administered 2018-03-19: 500 mg via INTRAVENOUS

## 2018-03-19 MED ORDER — PROPOFOL 10 MG/ML IV BOLUS
INTRAVENOUS | Status: AC
  Filled 2018-03-19: qty 20

## 2018-03-19 MED ORDER — DEXAMETHASONE SODIUM PHOSPHATE 4 MG/ML IJ SOLN
INTRAMUSCULAR | Status: AC
  Filled 2018-03-19: qty 1

## 2018-03-19 MED ORDER — GENTAMICIN SULFATE 40 MG/ML IJ SOLN
INTRAMUSCULAR | Status: AC
  Filled 2018-03-19: qty 2

## 2018-03-19 MED ORDER — FAMOTIDINE 20 MG PO TABS
20.0000 mg | ORAL_TABLET | Freq: Once | ORAL | Status: AC
  Administered 2018-03-19: 20 mg via ORAL

## 2018-03-19 MED ORDER — ONDANSETRON HCL 4 MG/2ML IJ SOLN
INTRAMUSCULAR | Status: AC
  Filled 2018-03-19: qty 2

## 2018-03-19 MED ORDER — ONDANSETRON HCL 4 MG/2ML IJ SOLN
INTRAMUSCULAR | Status: DC | PRN
  Administered 2018-03-19: 4 mg via INTRAVENOUS

## 2018-03-19 MED ORDER — LEVOFLOXACIN IN D5W 500 MG/100ML IV SOLN
INTRAVENOUS | Status: AC
  Filled 2018-03-19: qty 100

## 2018-03-19 MED ORDER — BELLADONNA ALKALOIDS-OPIUM 16.2-60 MG RE SUPP
RECTAL | Status: AC
  Filled 2018-03-19: qty 1

## 2018-03-19 MED ORDER — DEXAMETHASONE SODIUM PHOSPHATE 10 MG/ML IJ SOLN
INTRAMUSCULAR | Status: DC | PRN
  Administered 2018-03-19: 10 mg via INTRAVENOUS

## 2018-03-19 MED ORDER — LACTATED RINGERS IV SOLN
INTRAVENOUS | Status: DC
  Administered 2018-03-19 (×2): via INTRAVENOUS

## 2018-03-19 MED ORDER — IOHEXOL 180 MG/ML  SOLN
INTRAMUSCULAR | Status: DC | PRN
  Administered 2018-03-19: 20 mL

## 2018-03-19 MED ORDER — ROCURONIUM BROMIDE 50 MG/5ML IV SOLN
INTRAVENOUS | Status: AC
  Filled 2018-03-19: qty 1

## 2018-03-19 MED ORDER — PROPOFOL 10 MG/ML IV BOLUS
INTRAVENOUS | Status: DC | PRN
  Administered 2018-03-19: 160 mg via INTRAVENOUS

## 2018-03-19 MED ORDER — LIDOCAINE HCL (CARDIAC) PF 100 MG/5ML IV SOSY
PREFILLED_SYRINGE | INTRAVENOUS | Status: DC | PRN
  Administered 2018-03-19: 80 mg via INTRAVENOUS

## 2018-03-19 MED ORDER — FENTANYL CITRATE (PF) 100 MCG/2ML IJ SOLN
INTRAMUSCULAR | Status: AC
  Filled 2018-03-19: qty 2

## 2018-03-19 MED ORDER — FAMOTIDINE 20 MG PO TABS
ORAL_TABLET | ORAL | Status: AC
  Administered 2018-03-19: 20 mg via ORAL
  Filled 2018-03-19: qty 1

## 2018-03-19 MED ORDER — FENTANYL CITRATE (PF) 100 MCG/2ML IJ SOLN: 25.0000 ug | INTRAMUSCULAR | Status: DC | PRN

## 2018-03-19 MED ORDER — LEVOFLOXACIN 500 MG PO TABS: 500.0000 mg | ORAL_TABLET | Freq: Every day | ORAL | 0 refills | Status: AC

## 2018-03-19 MED ORDER — FENTANYL CITRATE (PF) 100 MCG/2ML IJ SOLN
INTRAMUSCULAR | Status: DC | PRN
  Administered 2018-03-19: 50 ug via INTRAVENOUS

## 2018-03-19 MED ORDER — SUCCINYLCHOLINE CHLORIDE 20 MG/ML IJ SOLN
INTRAMUSCULAR | Status: DC | PRN
  Administered 2018-03-19: 100 mg via INTRAVENOUS

## 2018-03-19 SURGICAL SUPPLY — 36 items
BAG DRAIN CYSTO-URO LG1000N (MISCELLANEOUS) ×3 IMPLANT
BASKET 3WIRE GEMINI 2.4X120 (MISCELLANEOUS) ×1 IMPLANT
CATH URETL OPEN END 6FR 70 (CATHETERS) ×1 IMPLANT
COVER MAYO STAND STRL (DRAPES) ×3 IMPLANT
COVER TRANSDUCER ULTRASOUND (MISCELLANEOUS) ×1 IMPLANT
ELECT URO BUGBEE 5F (MISCELLANEOUS) ×3
ELECTRODE URO BUGBEE 5F (MISCELLANEOUS) IMPLANT
FORCEPS BIOP PIRANHA Y (CUTTING FORCEPS) ×1 IMPLANT
GLOVE BIO SURGEON STRL SZ7 (GLOVE) ×6 IMPLANT
GLOVE BIO SURGEON STRL SZ7.5 (GLOVE) ×3 IMPLANT
GOWN STRL REUS W/ TWL LRG LVL3 (GOWN DISPOSABLE) ×2 IMPLANT
GOWN STRL REUS W/TWL LRG LVL3 (GOWN DISPOSABLE) ×1
GUIDE NDL ENDOCAV 16-18 CVR (NEEDLE) IMPLANT
GUIDE NDL URONAV ULTRASND S (MISCELLANEOUS) IMPLANT
GUIDE NEEDLE ENDOCAV 16-18 CVR (NEEDLE) IMPLANT
GUIDE NEEDLE URONAV ULTRASND S (MISCELLANEOUS) ×2 IMPLANT
GUIDEWIRE STR ZIPWIRE 035X150 (MISCELLANEOUS) ×3 IMPLANT
INST BIOPSY MAXCORE 18GX25 (NEEDLE) ×3 IMPLANT
KIT TURNOVER CYSTO (KITS) ×3 IMPLANT
NDL GUIDE BIOPSY 644068 (NEEDLE) IMPLANT
NEEDLE GUIDE BIOPSY 644068 (NEEDLE) IMPLANT
PACK CYSTO AR (MISCELLANEOUS) ×3 IMPLANT
PROBE BIOSP ALOKA ALPHA6 PROST (MISCELLANEOUS) ×1 IMPLANT
PROBE URONAV BK 8808E 8818 HLD (MISCELLANEOUS) IMPLANT
SET CYSTO W/LG BORE CLAMP LF (SET/KITS/TRAYS/PACK) ×3 IMPLANT
SOL .9 NS 3000ML IRR  AL (IV SOLUTION) ×1
SOL .9 NS 3000ML IRR UROMATIC (IV SOLUTION) ×2 IMPLANT
SOL PREP PVP 2OZ (MISCELLANEOUS) ×3
SOLUTION PREP PVP 2OZ (MISCELLANEOUS) ×2 IMPLANT
SURGILUBE 2OZ TUBE FLIPTOP (MISCELLANEOUS) ×3 IMPLANT
TOWEL OR 17X26 4PK STRL BLUE (TOWEL DISPOSABLE) ×3 IMPLANT
URONAV BK 8808E 8818 PROBE HLD (MISCELLANEOUS) ×3
URONAV MRI FUSION TWO PATIENTS (MISCELLANEOUS) ×1 IMPLANT
URONAV ULTRASOUND (MISCELLANEOUS) ×1 IMPLANT
URONAV ULTRASOUND NDL GUIDE S (MISCELLANEOUS) ×3
WATER STERILE IRR 1000ML POUR (IV SOLUTION) ×3 IMPLANT

## 2018-03-19 NOTE — Anesthesia Postprocedure Evaluation (Signed)
Anesthesia Post Note  Patient: Adam Mcmahon  Procedure(s) Performed: RIGHT URETEROSCOPY WITH URETERAL BIOPSY (Right ) URO NAV PROSTATE BIOPSY (Right ) CYSTOSCOPY WITH RETROGRADE PYELOGRAM (Right Ureter)  Patient location during evaluation: PACU Anesthesia Type: General Level of consciousness: awake and alert Pain management: pain level controlled Vital Signs Assessment: post-procedure vital signs reviewed and stable Respiratory status: spontaneous breathing, nonlabored ventilation, respiratory function stable and patient connected to nasal cannula oxygen Cardiovascular status: blood pressure returned to baseline and stable Postop Assessment: no apparent nausea or vomiting Anesthetic complications: no     Last Vitals:  Vitals:   03/19/18 1559 03/19/18 1651  BP: (!) 172/91 (!) 177/84  Pulse: 70 60  Resp: 10 14  Temp: 36.9 C (!) 36.4 C  SpO2: 97% 98%    Last Pain:  Vitals:   03/19/18 1651  TempSrc: Oral  PainSc: 0-No pain                 Brisha Mccabe S

## 2018-03-19 NOTE — Anesthesia Procedure Notes (Signed)
Procedure Name: Intubation Date/Time: 03/19/2018 1:45 PM Performed by: Allean Found, CRNA Pre-anesthesia Checklist: Patient identified, Patient being monitored, Timeout performed, Emergency Drugs available and Suction available Patient Re-evaluated:Patient Re-evaluated prior to induction Oxygen Delivery Method: Circle system utilized Preoxygenation: Pre-oxygenation with 100% oxygen Induction Type: IV induction Ventilation: Mask ventilation without difficulty Laryngoscope Size: 3, 4 and McGraph Grade View: Grade II Tube type: Oral Tube size: 7.5 mm Number of attempts: 1 Airway Equipment and Method: Stylet Placement Confirmation: ETT inserted through vocal cords under direct vision,  positive ETCO2 and breath sounds checked- equal and bilateral Secured at: 21 cm Tube secured with: Tape Dental Injury: Teeth and Oropharynx as per pre-operative assessment

## 2018-03-19 NOTE — Anesthesia Preprocedure Evaluation (Signed)
Anesthesia Evaluation  Patient identified by MRN, date of birth, ID band Patient awake    Airway Mallampati: III  TM Distance: <3 FB     Dental  (+) Chipped, Caps   Pulmonary former smoker,    Pulmonary exam normal        Cardiovascular negative cardio ROS Normal cardiovascular exam     Neuro/Psych PSYCHIATRIC DISORDERS Depression negative neurological ROS     GI/Hepatic Neg liver ROS, GERD  ,  Endo/Other  diabetes  Renal/GU negative Renal ROS  negative genitourinary   Musculoskeletal   Abdominal Normal abdominal exam  (+)   Peds negative pediatric ROS (+)  Hematology negative hematology ROS (+)   Anesthesia Other Findings Past Medical History: No date: Depression No date: Diabetes mellitus without complication (HCC) No date: GERD (gastroesophageal reflux disease) No date: History of kidney stones No date: Prostate disease  Reproductive/Obstetrics                             Anesthesia Physical Anesthesia Plan  ASA: II  Anesthesia Plan: General   Post-op Pain Management:    Induction: Intravenous  PONV Risk Score and Plan:   Airway Management Planned: Oral ETT  Additional Equipment:   Intra-op Plan:   Post-operative Plan: Extubation in OR  Informed Consent: I have reviewed the patients History and Physical, chart, labs and discussed the procedure including the risks, benefits and alternatives for the proposed anesthesia with the patient or authorized representative who has indicated his/her understanding and acceptance.       Plan Discussed with: CRNA and Surgeon  Anesthesia Plan Comments:         Anesthesia Quick Evaluation

## 2018-03-19 NOTE — H&P (Signed)
Date of Initial H&P: 03/14/18 History reviewed, patient examined, no change in status, stable for surgery.

## 2018-03-19 NOTE — Op Note (Signed)
Preoperative diagnosis: 1.  Right ureteral tumor                                             2.  Elevated PSA                                             3.  High-grade PI-RADS lesion of the right side of the prostate                                               Postoperative diagnosis: Same  Procedure: 1.  Right ureteroscopic biopsy of right ureter                      2.  Right retrograde pyelography                      3.  Vernelle Emerald fusion transrectal prostate biopsy  Surgeon: Otelia Limes. Yves Dill MD  Anesthesia: General  Indications:See the history and physical. After informed consent the above procedure(s) were requested     Technique and findings: After adequate general anesthesia been obtained the patient was placed into dorsal lithotomy position and the perineum was prepped and draped in the usual fashion.  A 1 French cystoscope was coupled to the camera and visually advanced into the bladder.  The bladder was really examined and no bladder mucosal lesions were identified.  Right ureteral orifice was cannulated with a 6 [place] open-ended catheter and retrograde pyelography was performed.  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  Contrast could not be seen proximal to the tumor.  An attempt was made to pass a 0.035 Glidewire beyond the tumor but this was not successful due to obstruction.  At this point the short mini rigid ureteroscope coupled with the camera and advanced into the distal right ureter.  A tumor was encountered in the distal ureter.  Several cold cup procedures were performed and specimens submitted to pathology.  The ureteroscope could not be advanced beyond the tumor.  At this point the ureteroscope was removed and cystoscope placed back into the bladder.  No bleeding seen coming from the orifice.  The cystoscope was then removed and 10 cc of viscous Xylocaine instilled within the urethra.  55  French Foley catheter was placed and he milligrams of mitomycin-C instilled into the bladder and the catheter was clamped.  The patient was placed into the left lateral decubitus position transrectal ultrasound probe placed.  The sound images were acquired and fused with the MRI images.  Region of interest #1 was identified and 3 core biopsies obtained here.  Region of interest #2 was identified and 4 core biopsies obtained from this location.  At this point standard template 12 core biopsies were obtained.  The ultrasound was removed procedure terminated.  Patient tolerated procedure well.  Blood loss was minimal.

## 2018-03-19 NOTE — Discharge Instructions (Addendum)
Ureteroscopy, Care After This sheet gives you information about how to care for yourself after your procedure. Your health care provider may also give you more specific instructions. If you have problems or questions, contact your health care provider. What can I expect after the procedure? After the procedure, it is common to have:  A burning sensation when you urinate.  Blood in your urine.  Mild discomfort in the bladder area or kidney area when urinating.  Needing to urinate more often or urgently. Follow these instructions at home:  Medicines  Take over-the-counter and prescription medicines only as told by your health care provider.  If you were prescribed an antibiotic medicine, take it as told by your health care provider. Do not stop taking the antibiotic even if you start to feel better. General instructions  Donot drive for 24 hours if you were given a medicine to help you relax (sedative) during your procedure.  To relieve burning, try taking a warm bath or holding a warm washcloth over your groin.  Drink enough fluid to keep your urine clear or pale yellow. ? Drink two 8-ounce glasses of water every hour for the first 2 hours after you get home. ? Continue to drink water often at home.  You can eat what you usually do.  Keep all follow-up visits as told by your health care provider. This is important. ? If you had a tube placed to keep urine flowing (ureteral stent), ask your health care provider when you need to return to have it removed. Contact a health care provider if:  You have chills or a fever.  You have burning pain for longer than 24 hours after the procedure.  You have blood in your urine for longer than 24 hours after the procedure. Get help right away if:  You have large amounts of blood in your urine.  You have blood clots in your urine.  You have very bad pain.  You have chest pain or trouble breathing.  You are unable to urinate and you  have the feeling of a full bladder. This information is not intended to replace advice given to you by your health care provider. Make sure you discuss any questions you have with your health care provider. Document Released: 01/14/2013 Document Revised: 10/26/2015 Document Reviewed: 10/22/2015 Elsevier Interactive Patient Education  2019 [employer] Transrectal Ultrasound-Guided Prostate Biopsy, Care After This sheet gives you information about how to care for yourself after your procedure. Your doctor may also give you more specific instructions. If you have problems or questions, contact your doctor. What can I expect after the procedure? After the procedure, it is common to have:  Pain and discomfort in your butt, especially while sitting.  Pink-colored pee (urine), due to small amounts of blood in the pee.  Burning while peeing (urinating).  Blood in your poop (stool).  Bleeding from your butt.  Blood in your semen. Follow these instructions at home: Medicines  Take over-the-counter and prescription medicines only as told by your doctor.  If you were prescribed antibiotic medicine, take it as told by your doctor. Do not stop taking the antibiotic even if you start to feel better. Activity   Do not drive for 24 hours if you were given a medicine to help you relax (sedative) during your procedure.  Return to your normal activities as told by your doctor. Ask your doctor what activities are safe for you.  Ask your doctor when it is okay for you to  have sex.  Do not lift anything that is heavier than 10 lb (4.5 kg), or the limit that you are told, until your doctor says that it is safe. General instructions   Drink enough water to keep your pee pale yellow.  Watch your pee, poop, and semen for new bleeding or bleeding that gets worse.  Keep all follow-up visits as told by your doctor. This is important. Contact a doctor if you:  Have blood clots in your pee or  poop.  Notice that your pee smells bad or unusual.  Have very bad belly pain.  Have trouble peeing.  Notice that your lower belly feels firm.  Have blood in your pee for more than 2 weeks after the procedure.  Have blood in your semen for more than 2 months after the procedure.  Have problems getting an erection.  Feel sick to your stomach (nauseous).  Throw up (vomit).  Have new or worse bleeding in your pee, poop, or semen. Get help right away if you:  Have a fever or chills.  Have bright red pee.  Have very bad pain that does not get better with medicine.  Cannot pee. Summary  After this procedure, it is common to have pain and discomfort around your butt, especially while sitting.  You may have blood in your pee and poop.  It is common to have blood in your semen for 1-2 months.  If you were prescribed antibiotic medicine, take it as told by your doctor. Do not stop taking the antibiotic even if you start to feel better.  Get help right away if you have a fever or chills. This information is not intended to replace advice given to you by your health care provider. Make sure you discuss any questions you have with your health care provider. Document Released: 11/07/2016 Document Revised: 11/07/2016 Document Reviewed: 11/07/2016 Elsevier Interactive Patient Education  2019 [place]   1) The drugs that you were given will stay in your system until tomorrow so for the next 24 hours you should not:  A) Drive an automobile B) Make any legal decisions C) Drink any alcoholic beverage   2) You may resume regular meals tomorrow.  Today it is better to start with liquids and gradually work up to solid foods.  You may eat anything you prefer, but it is better to start with liquids, then soup and crackers, and gradually work up to solid foods.   3) Please notify your doctor immediately if you have any unusual  bleeding, trouble breathing, redness and pain at the surgery site, drainage, fever, or pain not relieved by medication.    4) Additional Instructions:        Please contact your physician with any problems or Same Day Surgery at [phone], Monday through Friday 6 am to 4 pm, or [place] at [facility] number at [phone].

## 2018-03-19 NOTE — Anesthesia Post-op Follow-up Note (Signed)
Anesthesia QCDR form completed.        

## 2018-03-19 NOTE — Transfer of Care (Signed)
Immediate Anesthesia Transfer of Care Note  Patient: Adam Mcmahon  Procedure(s) Performed: RIGHT URETEROSCOPY WITH URETERAL BIOPSY (Right ) URO NAV PROSTATE BIOPSY (Right ) CYSTOSCOPY WITH RETROGRADE PYELOGRAM (Right Ureter)  Patient Location: PACU  Anesthesia Type:General  Level of Consciousness: awake, alert  and oriented  Airway & Oxygen Therapy: Patient Spontanous Breathing and Patient connected to face mask oxygen  Post-op Assessment: Report given to RN and Post -op Vital signs reviewed and stable  Post vital signs: Reviewed and stable  Last Vitals:  Vitals Value Taken Time  BP 168/100 03/19/2018  3:29 PM  Temp 36.9 C 03/19/2018  3:29 PM  Pulse 76 03/19/2018  3:33 PM  Resp 11 03/19/2018  3:33 PM  SpO2 100 % 03/19/2018  3:33 PM  Vitals shown include unvalidated device data.  Last Pain:  Vitals:   03/19/18 1204  TempSrc: Tympanic  PainSc: 0-No pain         Complications: No apparent anesthesia complications

## 2018-03-20 ENCOUNTER — Encounter: Payer: Self-pay | Admitting: Urology

## 2018-03-22 LAB — SURGICAL PATHOLOGY

## 2018-04-01 ENCOUNTER — Telehealth: Payer: Self-pay | Admitting: Family Medicine

## 2018-04-01 NOTE — Telephone Encounter (Signed)
Called to schedule Medicare Annual Wellness Visit with the Nurse Health Advisor.  Patient wants to wait on this appointment.  Patient wants me to let Dr. Parks Ranger know he went to see Dr. Yves Dill and he did a biopsy.  Patient states he will be scheduled to have surgery to remove Right kidney, Ureter, and partial bladder.  I advised the patient to call the office to inform Dr. Jerene Pitch.  I also wanted to send a note to inform the doctor, if the patient did not call. Thank you! For any questions please contact: Janace Hoard at [phone] or Skype [email or web address]

## 2018-04-01 NOTE — Telephone Encounter (Signed)
Acknowledged. Reviewed op note from Dr Yves Dill [facility]  Adam Mcmahon, [name] 04/01/2018, 9:47 PM

## 2018-04-05 ENCOUNTER — Encounter: Payer: Self-pay | Admitting: Urology

## 2018-04-05 LAB — SURGICAL PATHOLOGY

## 2019-01-14 ENCOUNTER — Telehealth: Payer: Self-pay | Admitting: Family Medicine

## 2019-01-14 NOTE — Telephone Encounter (Signed)
I called the patient to schedule AWV with Tiffany and follow up visit with Dr. Raliegh Ip.  He said that he's in [place] right now undergoing immunotherapy and will be there for awhile.  I told him that I would let Dr. Raliegh Ip know. FYI

## 2019-05-18 IMAGING — DX DG ABDOMEN 1V
2 series · 3 of 3 positions shown · non-contrast
Comparison: None.

CLINICAL DATA: Right flank pain for 1 week.

EXAM:
ABDOMEN - 1 VIEW

[abdomen kub (1 of 2)]
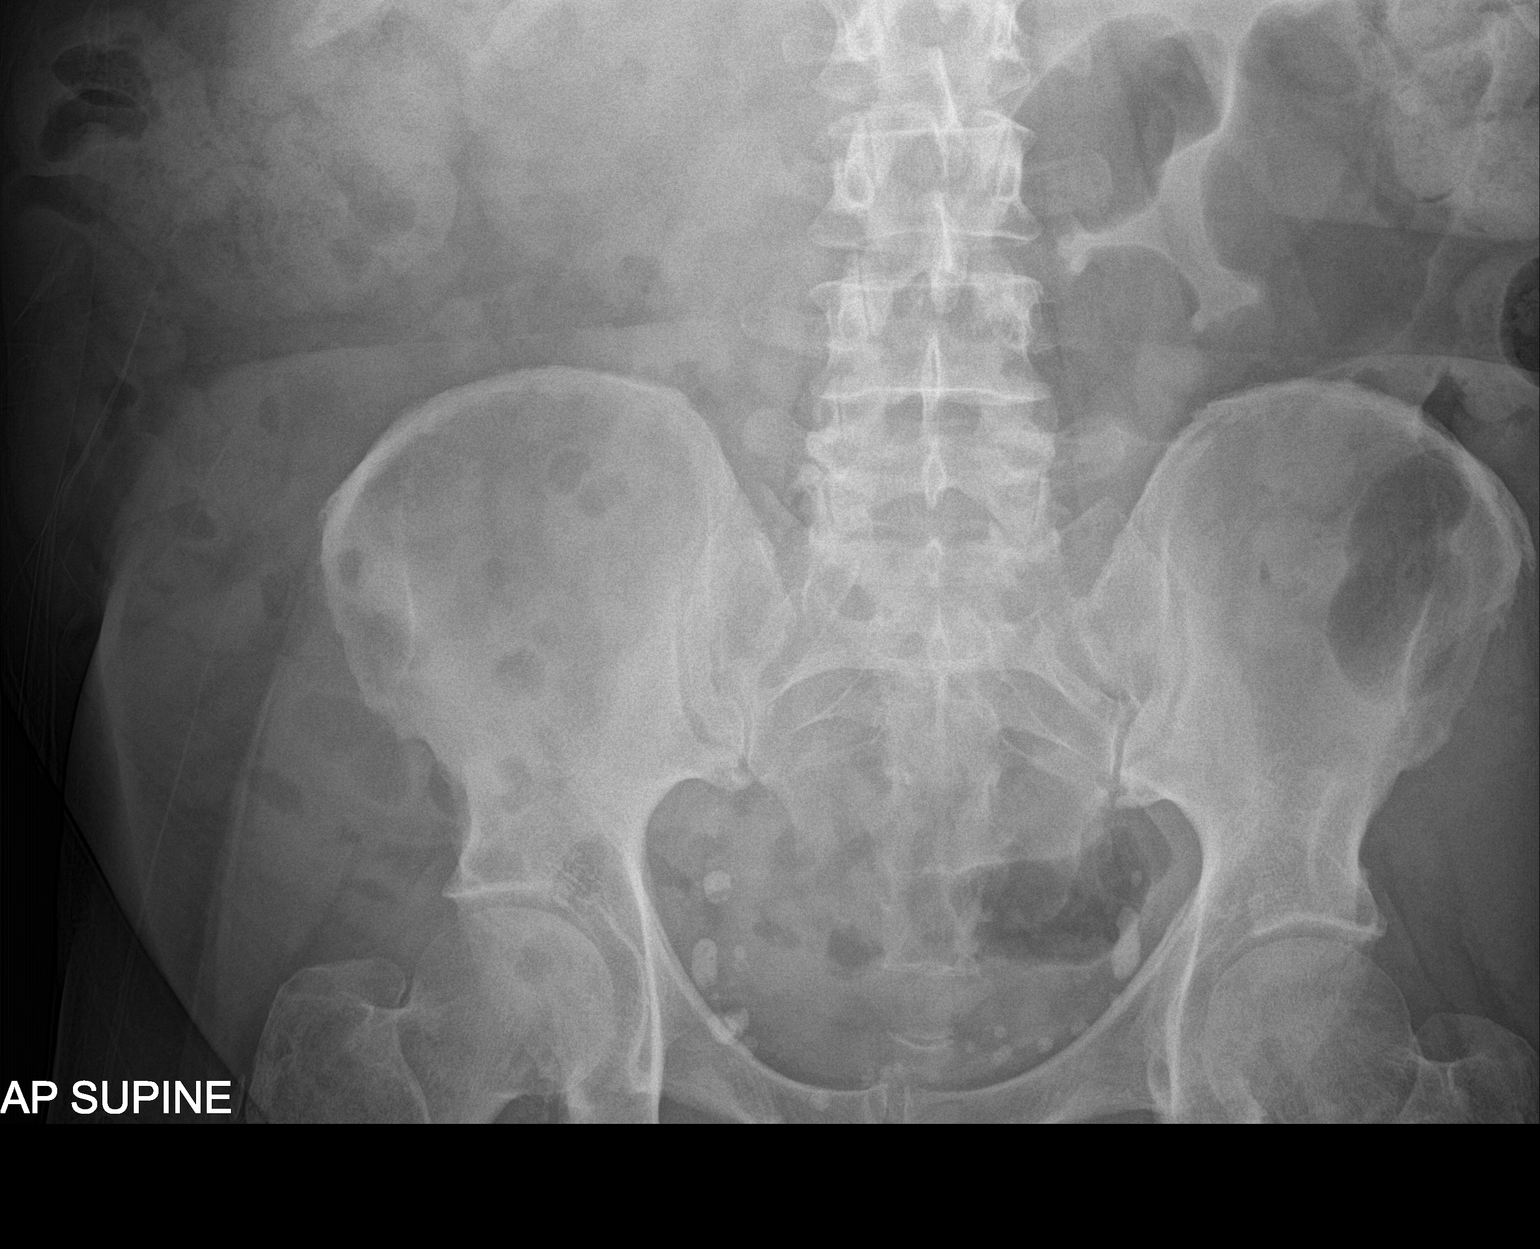

[Series 2: abdomen kub · 0.14mm/px · 2 of 2 slices shown (2 of 2)]
[im 1/2]
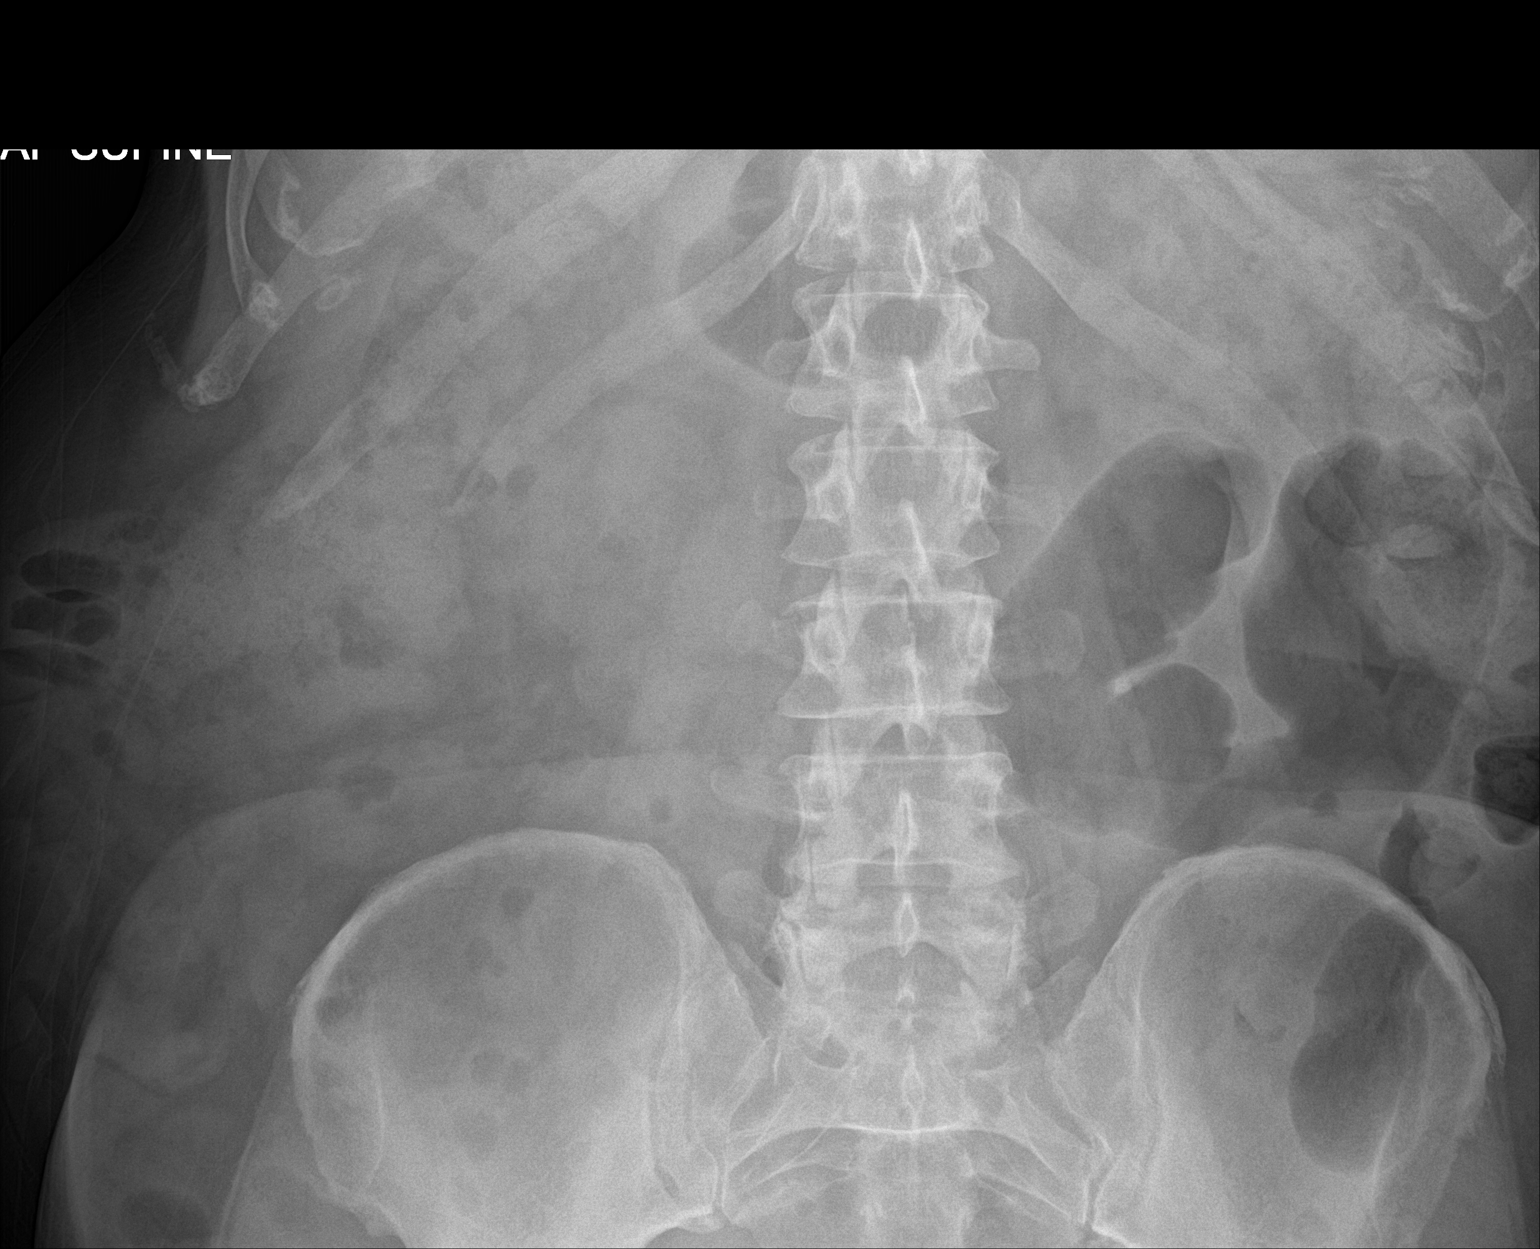
[im 2/2]
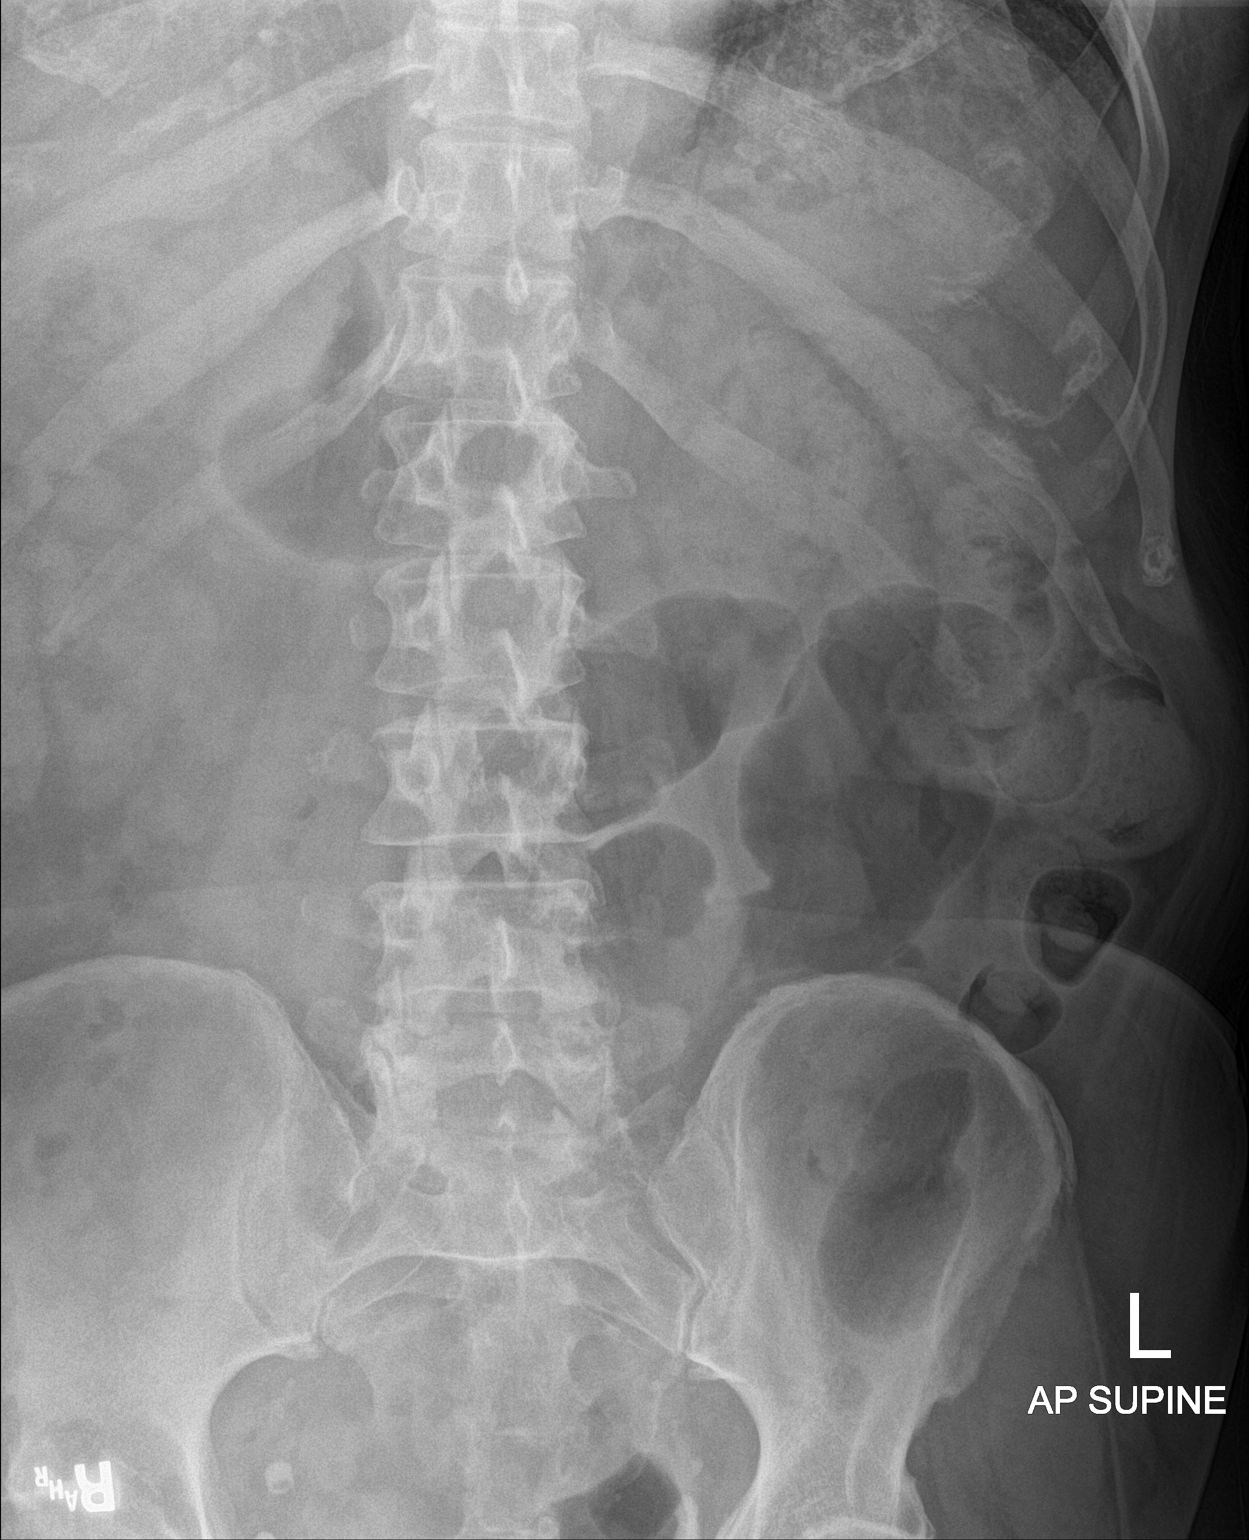

[3 of 3 positions shown; findings below may reference images not displayed]

FINDINGS: The bowel gas pattern is normal. Pelvic phleboliths are identified
in the pelvis. Extensive bowel content is identified throughout
colon
IMPRESSION: No bowel obstruction. Extensive bowel content is identified
throughout colon, consistent with constipation.
